# Patient Record
Sex: Male | Born: 1985 | Race: Black or African American | Hispanic: No | Marital: Single | Smoking: Current every day smoker
Health system: Southern US, Community
[De-identification: ages and names within clinical notes are randomized; demographics above are authoritative.]

---

## 2021-10-21 ENCOUNTER — Other Ambulatory Visit: Payer: Self-pay

## 2021-10-21 ENCOUNTER — Emergency Department (HOSPITAL_COMMUNITY): Payer: Self-pay

## 2021-10-21 ENCOUNTER — Encounter (HOSPITAL_COMMUNITY): Payer: Self-pay

## 2021-10-21 ENCOUNTER — Emergency Department (HOSPITAL_COMMUNITY)
Admission: EM | Admit: 2021-10-21 | Discharge: 2021-10-21 | Disposition: A | Discharge status: Home / Self Care | Payer: Self-pay | Attending: Emergency Medicine | Admitting: Emergency Medicine

## 2021-10-21 DIAGNOSIS — W06XXXA Fall from bed, initial encounter: Secondary | ICD-10-CM | POA: Insufficient documentation

## 2021-10-21 DIAGNOSIS — M542 Cervicalgia: Secondary | ICD-10-CM | POA: Insufficient documentation

## 2021-10-21 DIAGNOSIS — R509 Fever, unspecified: Secondary | ICD-10-CM | POA: Insufficient documentation

## 2021-10-21 DIAGNOSIS — R519 Headache, unspecified: Secondary | ICD-10-CM | POA: Insufficient documentation

## 2021-10-21 DIAGNOSIS — N390 Urinary tract infection, site not specified: Secondary | ICD-10-CM

## 2021-10-21 DIAGNOSIS — R42 Dizziness and giddiness: Secondary | ICD-10-CM | POA: Insufficient documentation

## 2021-10-21 DIAGNOSIS — Z20822 Contact with and (suspected) exposure to covid-19: Secondary | ICD-10-CM | POA: Insufficient documentation

## 2021-10-21 DIAGNOSIS — R000 Tachycardia, unspecified: Secondary | ICD-10-CM | POA: Insufficient documentation

## 2021-10-21 LAB — LACTIC ACID, PLASMA: Lactic Acid, Venous: 1.1 mmol/L (ref 0.5–1.9)

## 2021-10-21 LAB — COMPREHENSIVE METABOLIC PANEL WITH GFR
ALT: 32 U/L (ref 0–44)
AST: 39 U/L (ref 15–41)
Albumin: 4.3 g/dL (ref 3.5–5.0)
Alkaline Phosphatase: 80 U/L (ref 38–126)
Anion gap: 13 (ref 5–15)
BUN: 18 mg/dL (ref 6–20)
CO2: 24 mmol/L (ref 22–32)
Calcium: 9.1 mg/dL (ref 8.9–10.3)
Chloride: 93 mmol/L — ABNORMAL LOW (ref 98–111)
Creatinine, Ser: 1.61 mg/dL — ABNORMAL HIGH (ref 0.61–1.24)
GFR, Estimated: 58 mL/min — ABNORMAL LOW (ref >60)
Glucose, Bld: 136 mg/dL — ABNORMAL HIGH (ref 70–99)
Potassium: 3.2 mmol/L — ABNORMAL LOW (ref 3.5–5.1)
Sodium: 130 mmol/L — ABNORMAL LOW (ref 135–145)
Total Bilirubin: 1.1 mg/dL (ref 0.3–1.2)
Total Protein: 9.2 g/dL — ABNORMAL HIGH (ref 6.5–8.1)

## 2021-10-21 LAB — CBC WITH DIFFERENTIAL/PLATELET
Abs Immature Granulocytes: 0.09 K/uL — ABNORMAL HIGH (ref 0.00–0.07)
Basophils Absolute: 0 K/uL (ref 0.0–0.1)
Basophils Relative: 0 %
Eosinophils Absolute: 0 K/uL (ref 0.0–0.5)
Eosinophils Relative: 0 %
HCT: 47.1 % (ref 39.0–52.0)
Hemoglobin: 16.3 g/dL (ref 13.0–17.0)
Immature Granulocytes: 1 %
Lymphocytes Relative: 8 %
Lymphs Abs: 1.5 K/uL (ref 0.7–4.0)
MCH: 30.5 pg (ref 26.0–34.0)
MCHC: 34.6 g/dL (ref 30.0–36.0)
MCV: 88.2 fL (ref 80.0–100.0)
Monocytes Absolute: 2.4 K/uL — ABNORMAL HIGH (ref 0.1–1.0)
Monocytes Relative: 13 %
Neutro Abs: 15.4 K/uL — ABNORMAL HIGH (ref 1.7–7.7)
Neutrophils Relative %: 78 %
Platelets: 250 K/uL (ref 150–400)
RBC: 5.34 MIL/uL (ref 4.22–5.81)
RDW: 12.6 % (ref 11.5–15.5)
WBC: 19.4 K/uL — ABNORMAL HIGH (ref 4.0–10.5)
nRBC: 0 % (ref 0.0–0.2)

## 2021-10-21 LAB — URINALYSIS, ROUTINE W REFLEX MICROSCOPIC
Glucose, UA: NEGATIVE mg/dL
Leukocytes,Ua: NEGATIVE
Nitrite: POSITIVE — AB
Protein, ur: >=300 mg/dL — AB
Specific Gravity, Urine: >=1.03 (ref 1.005–1.030)
pH: 6.5 (ref 5.0–8.0)

## 2021-10-21 LAB — PROTIME-INR
INR: 1.2 (ref 0.8–1.2)
Prothrombin Time: 15.1 s (ref 11.4–15.2)

## 2021-10-21 LAB — RESP PANEL BY RT-PCR (FLU A&B, COVID) ARPGX2
Influenza A by PCR: NEGATIVE
Influenza B by PCR: NEGATIVE
SARS Coronavirus 2 by RT PCR: NEGATIVE

## 2021-10-21 LAB — GROUP A STREP BY PCR: Group A Strep by PCR: NOT DETECTED

## 2021-10-21 MED ORDER — CEPHALEXIN 500 MG PO CAPS: 500.0000 mg | ORAL_CAPSULE | Freq: Four times a day (QID) | ORAL | 0 refills | Status: AC

## 2021-10-21 MED ORDER — ACETAMINOPHEN 325 MG PO TABS
650.0000 mg | ORAL_TABLET | Freq: Once | ORAL | Status: AC
  Administered 2021-10-21: 14:00:00 650 mg via ORAL
  Filled 2021-10-21: qty 2

## 2021-10-21 MED ORDER — SODIUM CHLORIDE 0.9 % IV SOLN
1.0000 g | Freq: Once | INTRAVENOUS | Status: AC
  Administered 2021-10-21: 19:00:00 1 g via INTRAVENOUS
  Filled 2021-10-21: qty 10

## 2021-10-21 MED ORDER — IBUPROFEN 200 MG PO TABS: 600.0000 mg | ORAL_TABLET | Freq: Once | ORAL | Status: DC

## 2021-10-21 MED ORDER — SODIUM CHLORIDE 0.9 % IV BOLUS
1000.0000 mL | Freq: Once | INTRAVENOUS | Status: AC
  Administered 2021-10-21: 15:00:00 1000 mL via INTRAVENOUS

## 2021-10-21 MED ORDER — POTASSIUM CHLORIDE CRYS ER 20 MEQ PO TBCR
40.0000 meq | EXTENDED_RELEASE_TABLET | Freq: Once | ORAL | Status: AC
  Administered 2021-10-21: 16:00:00 40 meq via ORAL
  Filled 2021-10-21: qty 2

## 2021-10-21 NOTE — ED Triage Notes (Signed)
Woke up Monday morning with a headache and neck stiffness. Also had cold chills and sweats.   Yesterday pt slept all day. Pt fiance tried to wake him up and pt was disoriented and unable to answer questions.   Pt lost his balance this morning and fell down the stairs. Per fiance pt is still not answer questions correctly or respond at all.   A/OX3. Pt did not know the year but new it was December.

## 2021-10-21 NOTE — Discharge Instructions (Signed)
You have a urinary tract infection.  Please take Keflex 4 times daily for the next 10 days.  It is very important that you complete all of your antibiotics even if your symptoms resolve.  For fevers and pain you can take Tylenol 1000 mg every 6 hours.  Avoid ibuprofen, Aleve, Motrin, aspirin or other NSAIDs as these can worsen your kidney function.  Your kidney function was elevated we do not have any prior labs to compare to.  You will need to follow-up closely to have this rechecked.  Please call tomorrow morning to schedule close follow-up appointment with urology.  If you have worsening fevers, vomiting, abdominal pain or back pain you should return to the emergency department for reevaluation.  We discussed doing a CT scan today but you prefer to go home with antibiotics, if your symptoms worsen you will likely need the scan for further evaluation as urinary tract infections are uncommon in men.  The rest of your work-up today was reassuring.  Negative test for COVID and influenza.

## 2021-10-21 NOTE — ED Provider Notes (Signed)
Emergency Medicine Provider Triage Evaluation Note  Dominic Ruiz , a 35 y.o. male  was evaluated in triage.  Pt complains of body aches, headache, neck stiffness. Confusion, balance issue, not answering questions as normal.  Review of Systems  Positive: Headache, neck stiffness Negative: Chest pain, shortness of breath  Physical Exam  BP (!) 118/92 (BP Location: Left Arm)    Pulse (!) 114    Temp (!) 103.3 F (39.6 C) (Oral)    Resp 18    SpO2 96%  Gen:   Awake, ill appearing Resp:  Normal effort MSK:   Moves extremities without difficulty  Other:  Cn3-12 grossly intact, neck pain with movement but still intact rom, intact strength 4/4 bil UE / LE  Medical Decision Making  Medically screening exam initiated at 1:59 PM.  Appropriate orders placed.  Dominic Ruiz was informed that the remainder of the evaluation will be completed by another provider, this initial triage assessment does not replace that evaluation, and the importance of remaining in the ED until their evaluation is complete.  Worried about meningitis, or sepsis of unknown origin given high fever, patient to be sent to room immediately   [place] 10/21/21 1401    Bethann Berkshire, MD 10/21/21 1621

## 2021-10-21 NOTE — ED Provider Notes (Signed)
Care assumed from PA Fayrene Helper at shift change, please see his note for full details, but in brief Dominic Ruiz is a 35 y.o. male patient presents with 3 days of fever, chills and headache.  He denies other associated symptoms.  Headaches have been intermittent, described as a bandlike headache.  He also reports some decreased appetite.  No known sick contacts.  Despite fever and reported headache patient is very well-appearing with no meningeal signs.  Prior care team including PA Fayrene Helper and Dr. Rosalia Hammers both felt that this was very unlikely to be meningitis and the patient did not need a lumbar puncture.  Patient's lab work has been significant for a leukocytosis of 19.4.  CMP shows creatinine of 1.61 with no prior available for comparison, potassium of 3.2, p.o. potassium replacement given and sodium of 130, patient given IV fluids.  Normal LFTs.  Lactic acid is within normal limits.  Blood cultures pending.  Chest x-ray without evidence of pneumonia or other acute abnormality.  Respiratory panel, strep test and urinalysis pending at shift change.  Physical Exam  BP (!) 138/92    Pulse (!) 109    Temp 99 F (37.2 C) (Oral)    Resp 20    Ht 5\' 9"  (1.753 m)    Wt 95.3 kg    SpO2 100%    BMI 31.01 kg/m   Physical Exam Vitals and nursing note reviewed.  Constitutional:      General: He is not in acute distress.    Appearance: Normal appearance. He is well-developed. He is not ill-appearing or diaphoretic.     Comments: Alert, well appearing and in no acute distress  HENT:     Head: Normocephalic and atraumatic.     Mouth/Throat:     Mouth: Mucous membranes are moist.     Pharynx: Oropharynx is clear. Posterior oropharyngeal erythema present.  Eyes:     General:        Right eye: No discharge.        Left eye: No discharge.  Cardiovascular:     Rate and Rhythm: Regular rhythm. Tachycardia present.     Pulses: Normal pulses.     Heart sounds: Normal heart sounds.  Pulmonary:      Effort: Pulmonary effort is normal. No respiratory distress.     Comments: Respirations equal and unlabored, patient able to speak in full sentences, lungs clear to auscultation bilaterally  Abdominal:     General: There is no distension.     Palpations: Abdomen is soft. There is no mass.     Tenderness: There is no abdominal tenderness. There is no right CVA tenderness, left CVA tenderness or guarding.     Comments: Abdomen soft, nondistended, nontender to palpation in all quadrants without guarding or peritoneal signs, No CVA tenderness  Musculoskeletal:        General: No deformity.     Cervical back: Neck supple. No rigidity.  Skin:    General: Skin is warm and dry.     Findings: No rash.  Neurological:     Mental Status: He is alert and oriented to person, place, and time.     Coordination: Coordination normal.  Psychiatric:        Mood and Affect: Mood normal.        Behavior: Behavior normal.    ED Course/Procedures   Labs Reviewed  COMPREHENSIVE METABOLIC PANEL - Abnormal; Notable for the following components:      Result Value  Sodium 130 (*)    Potassium 3.2 (*)    Chloride 93 (*)    Glucose, Bld 136 (*)    Creatinine, Ser 1.61 (*)    Total Protein 9.2 (*)    GFR, Estimated 58 (*)    All other components within normal limits  CBC WITH DIFFERENTIAL/PLATELET - Abnormal; Notable for the following components:   WBC 19.4 (*)    Neutro Abs 15.4 (*)    Monocytes Absolute 2.4 (*)    Abs Immature Granulocytes 0.09 (*)    All other components within normal limits  RESP PANEL BY RT-PCR (FLU A&B, COVID) ARPGX2  GROUP A STREP BY PCR  CULTURE, BLOOD (ROUTINE X 2)  CULTURE, BLOOD (ROUTINE X 2)  LACTIC ACID, PLASMA  PROTIME-INR  URINALYSIS, ROUTINE W REFLEX MICROSCOPIC   DG Chest 2 View  Result Date: 10/21/2021 CLINICAL DATA:  Suspected sepsis. EXAM: CHEST - 2 VIEW COMPARISON:  None. FINDINGS: The heart size and mediastinal contours are within normal limits. No focal  consolidation. No pleural effusion. No pneumothorax. The visualized skeletal structures are unremarkable. IMPRESSION: No active cardiopulmonary disease. Electronically Signed   By: Maudry Mayhew M.D.   On: 10/21/2021 14:48     Procedures  MDM   After receiving IV fluids and Tylenol patient reports he is feeling much better, headache has resolved.  Strep testing as well as COVID and flu testing have all returned negative.  Chest x-ray without pneumonia.  Patient with no abdominal tenderness.  Awaiting UA.  Patient remains alert, well-appearing and mentating normally without meningeal signs, very low suspicion for meningitis.  Patient's urinalysis has positive nitrites, RBCs and WBCs present with rare bacteria.  Concerning for infection.  Patient has no prior history of urinary tract infection but also is not experiencing any flank pain, abdominal tenderness or CVA tenderness so lower suspicion for renal stone or pyelonephritis.  Urine culture sent.  Discussed with patient doing CT renal stone study but patient is anxious to leave, reports he is feeling much better and he would prefer to just take antibiotics.  We will give patient a dose of Rocephin here and treat him with extended course of Keflex.  Patient to avoid NSAIDs but can use Tylenol as needed for fevers or pain.  We will have patient follow-up closely with urology.  I have also provided patient with strict return precautions.  He expresses understanding and agreement with plan.  Discharged home with antibiotics       Legrand Rams 10/21/21 2100    Linwood Dibbles, MD 10/25/21 [phone]

## 2021-10-21 NOTE — ED Provider Notes (Signed)
[facility] [place] [facility]EM[facility]GENCY [facility]PT Provider Note   CSN: [id number] Arrival date & time: 10/21/21  1313     History Chief Complaint  Patient presents with   Fever    Dominic Ruiz is a 35 y.o. male.  The history is provided by the patient. No language interpreter was used.  Fever   35 year old mae with hx of asthma presenting for complaint of fever and headache. 3 days of fever, neck pain, chills, headache.  Denies sore throat, ear aches, sore throat, cp, sob, congestion, cough, abd pain, n/v/d, numbness or tingling of extremities.  No treatment tried.  Able to ambulate normally at home but feels lightheadedness and dizzy when he does.  Headache is intermittent, comes and goes without treatment.  Bandlike headache.  Not eating much but able to tolerates fluid.  No recent sick contact.  No alcohol/drugs/tobacco use.  No hx of HSV and no recent unprotected sex.  Yesterday he did have an incident when he fell down the stairs when he tripped on his leg, denies hitting head of LOC.  Fiance was concerns because pt is more confused and slept more than usual.  Pt does report feeling a bit off.  No other chronic medical condition.   History reviewed. No pertinent past medical history.  There are no problems to display for this patient.   History reviewed. No pertinent surgical history.     No family history on file.     Home Medications Prior to Admission medications   Not on File    Allergies    Patient has no allergy information on record.  Review of Systems   Review of Systems  Constitutional:  Positive for fever.  All other systems reviewed and are negative.  Physical Exam Updated Vital Signs BP (!) 118/92 (BP Location: Left Arm)    Pulse (!) 114    Temp (!) 103.3 F (39.6 C) (Oral)    Resp 18    SpO2 96%   Physical Exam Vitals and nursing note reviewed.  Constitutional:      General: He is not in acute distress.    Appearance: He is  well-developed.     Comments: Patient is laying in bed appears to be in no acute discomfort.  Patient is nontoxic in appearance.  HENT:     Head: Normocephalic and atraumatic.     Right Ear: Tympanic membrane normal.     Left Ear: Tympanic membrane normal.     Nose: Nose normal.     Mouth/Throat:     Mouth: Mucous membranes are moist.     Comments: Uvula midline.  Mild post-oropharyngeal erythema, no abscess.  Eyes:     Conjunctiva/sclera: Conjunctivae normal.  Neck:     Comments: No nuchal rigidity Cardiovascular:     Rate and Rhythm: Tachycardia present.     Pulses: Normal pulses.     Heart sounds: Normal heart sounds.  Pulmonary:     Effort: Pulmonary effort is normal.     Breath sounds: Normal breath sounds. No wheezing, rhonchi or rales.  Abdominal:     Palpations: Abdomen is soft.     Tenderness: There is no abdominal tenderness.  Musculoskeletal:     Cervical back: Normal range of motion and neck supple. No rigidity.     Comments: 5 out of 5 strength all 4 extremities  Skin:    Findings: No rash.  Neurological:     Mental Status: He is alert and oriented to  person, place, and time.     GCS: GCS eye subscore is 4. GCS verbal subscore is 5. GCS motor subscore is 6.     Cranial Nerves: Cranial nerves 2-12 are intact.     Sensory: Sensation is intact.     Motor: Motor function is intact.    ED Results / Procedures / Treatments   Labs (all labs ordered are listed, but only abnormal results are displayed) Labs Reviewed  COMPREHENSIVE METABOLIC PANEL - Abnormal; Notable for the following components:      Result Value   Sodium 130 (*)    Potassium 3.2 (*)    Chloride 93 (*)    Glucose, Bld 136 (*)    Creatinine, Ser 1.61 (*)    Total Protein 9.2 (*)    GFR, Estimated 58 (*)    All other components within normal limits  CBC WITH DIFFERENTIAL/PLATELET - Abnormal; Notable for the following components:   WBC 19.4 (*)    Neutro Abs 15.4 (*)    Monocytes Absolute 2.4  (*)    Abs Immature Granulocytes 0.09 (*)    All other components within normal limits  CULTURE, BLOOD (ROUTINE X 2)  CULTURE, BLOOD (ROUTINE X 2)  RESP PANEL BY RT-PCR (FLU A&B, COVID) ARPGX2  GROUP A STREP BY PCR  LACTIC ACID, PLASMA  PROTIME-INR  URINALYSIS, ROUTINE W REFLEX MICROSCOPIC    EKG None  Radiology No results found.  Procedures Procedures   Medications Ordered in ED Medications  acetaminophen (TYLENOL) tablet 650 mg (has no administration in time range)    ED Course  I have reviewed the triage vital signs and the nursing notes.  Pertinent labs & imaging results that were available during my care of the patient were reviewed by me and considered in my medical decision making (see chart for details).    MDM Rules/Calculators/A&P                           BP 134/84    Pulse (!) 103    Temp (!) 103.3 F (39.6 C) (Oral)    Resp 17    SpO2 92%   Final Clinical Impression(s) / ED Diagnoses Final diagnoses:  None    Rx / DC Orders ED Discharge Orders     None      2:31 PM Patient here with complaints of fever and headache.  No other cold symptoms noted.  Does have an oral temperature of 103.3 and is tachycardic with a heart rate of 114.  Patient however is without any nuchal rigidity concerning for meningitis.  He is overall well-appearing.  He does not have any focal neurodeficit on exam.  I have very low suspicion for meningitis as a source of his infection as he has absolutely no nuchal rigidity no photophobia or phonophobia.  Patient tripped and fell yesterday and but is more of a mechanical fall.  He is mentating appropriately.  We will give Tylenol IV fluid, work-up initiated.  Code sepsis initiated.  3:26 PM Mild aki with Cr. 1.6.  IVF given.  Normal lactic acid.  WBC is 19.  Pt sign out to oncoming team who will f/u on labs and determine disposition.  Care discussed with Dr. Rosalia Hammers.    Fayrene Helper, PA-C 10/21/21 1527    Margarita Grizzle,  MD 10/22/21 [phone]

## 2021-10-22 LAB — URINE CULTURE: Culture: NO GROWTH

## 2021-10-26 LAB — CULTURE, BLOOD (ROUTINE X 2)
Culture: NO GROWTH
Culture: NO GROWTH

## 2022-01-14 ENCOUNTER — Other Ambulatory Visit: Payer: Self-pay | Admitting: Urology

## 2022-01-14 DIAGNOSIS — R3129 Other microscopic hematuria: Secondary | ICD-10-CM

## 2022-03-18 ENCOUNTER — Other Ambulatory Visit: Payer: Self-pay

## 2022-04-12 ENCOUNTER — Ambulatory Visit
Admission: RE | Admit: 2022-04-12 | Discharge: 2022-04-12 | Disposition: A | Discharge status: Home / Self Care | Payer: Self-pay | Source: Ambulatory Visit | Attending: Urology | Admitting: Urology

## 2022-04-12 DIAGNOSIS — R3129 Other microscopic hematuria: Secondary | ICD-10-CM

## 2022-04-12 MED ORDER — IOPAMIDOL (ISOVUE-300) INJECTION 61%
150.0000 mL | Freq: Once | INTRAVENOUS | Status: AC | PRN
  Administered 2022-04-12: 150 mL via INTRAVENOUS

## 2022-11-22 ENCOUNTER — Ambulatory Visit (INDEPENDENT_AMBULATORY_CARE_PROVIDER_SITE_OTHER): Payer: 59 | Admitting: Medical

## 2022-11-22 VITALS — BP 135/80 | HR 83 | Resp 18 | Ht 69.0 in | Wt 214.0 lb

## 2022-11-22 DIAGNOSIS — R739 Hyperglycemia, unspecified: Secondary | ICD-10-CM | POA: Diagnosis not present

## 2022-11-22 DIAGNOSIS — Z0000 Encounter for general adult medical examination without abnormal findings: Secondary | ICD-10-CM | POA: Diagnosis not present

## 2022-11-22 NOTE — Patient Instructions (Addendum)
For you wellness exam today I have ordered cbc, cmp and lipid panel. Will get a1c with lab work.  Vaccine given today. Offered tdap and flu vaccine. Both declined.  Recommend exercise and healthy diet.  We will let you know lab results as they come in.  Follow up 2 weeks or sooner if needed.  Bp mild high initially then better when I checked. Want you to check bp daily at home 1-2 times daily. Check before smoking and after. Bp elevation could be cause of ha. Will see if bp level high and if you correlate with HAs. If no bp elevation but ha persists with light senstivity then will give migraine type med triptan. Presenlty for ha can use tylenol but avoid nsaids due to potential increase of blood pressure.  Want to see bp levels less than 140/90. Write down you bp levels to review on follow up in 2 weeks.   Preventive Care 46-90 Years Old, Male Preventive care refers to lifestyle choices and visits with your health care provider that can promote health and wellness. Preventive care visits are also called wellness exams. What can I expect for my preventive care visit? Counseling During your preventive care visit, your health care provider may ask about your: Medical history, including: Past medical problems. Family medical history. Current health, including: Emotional well-being. Home life and relationship well-being. Sexual activity. Lifestyle, including: Alcohol, nicotine or tobacco, and drug use. Access to firearms. Diet, exercise, and sleep habits. Safety issues such as seatbelt and bike helmet use. Sunscreen use. Work and work Statistician. Physical exam Your health care provider may check your: Height and weight. These may be used to calculate your BMI (body mass index). BMI is a measurement that tells if you are at a healthy weight. Waist circumference. This measures the distance around your waistline. This measurement also tells if you are at a healthy weight and may help  predict your risk of certain diseases, such as type 2 diabetes and high blood pressure. Heart rate and blood pressure. Body temperature. Skin for abnormal spots. What immunizations do I need?  Vaccines are usually given at various ages, according to a schedule. Your health care provider will recommend vaccines for you based on your age, medical history, and lifestyle or other factors, such as travel or where you work. What tests do I need? Screening Your health care provider may recommend screening tests for certain conditions. This may include: Lipid and cholesterol levels. Diabetes screening. This is done by checking your blood sugar (glucose) after you have not eaten for a while (fasting). Hepatitis B test. Hepatitis C test. HIV (human immunodeficiency virus) test. STI (sexually transmitted infection) testing, if you are at risk. Talk with your health care provider about your test results, treatment options, and if necessary, the need for more tests. Follow these instructions at home: Eating and drinking  Eat a healthy diet that includes fresh fruits and vegetables, whole grains, lean protein, and low-fat dairy products. Drink enough fluid to keep your urine pale yellow. Take vitamin and mineral supplements as recommended by your health care provider. Do not drink alcohol if your health care provider tells you not to drink. If you drink alcohol: Limit how much you have to 0-2 drinks a day. Know how much alcohol is in your drink. In the [place] one drink equals one 12 oz bottle of beer (355 mL), one 5 oz glass of wine (148 mL), or one 1 oz glass of hard liquor (44 mL). Lifestyle  Brush your teeth every morning and night with fluoride toothpaste. Floss one time each day. Exercise for at least 30 minutes 5 or more days each week. Do not use any products that contain nicotine or tobacco. These products include cigarettes, chewing tobacco, and vaping devices, such as e-cigarettes. If you  need help quitting, ask your health care provider. Do not use drugs. If you are sexually active, practice safe sex. Use a condom or other form of protection to prevent STIs. Find healthy ways to manage stress, such as: Meditation, yoga, or listening to music. Journaling. Talking to a trusted person. Spending time with friends and family. Minimize exposure to UV radiation to reduce your risk of skin cancer. Safety Always wear your seat belt while driving or riding in a vehicle. Do not drive: If you have been drinking alcohol. Do not ride with someone who has been drinking. If you have been using any mind-altering substances or drugs. While texting. When you are tired or distracted. Wear a helmet and other protective equipment during sports activities. If you have firearms in your house, make sure you follow all gun safety procedures. Seek help if you have been physically or sexually abused. What's next? Go to your health care provider once a year for an annual wellness visit. Ask your health care provider how often you should have your eyes and teeth checked. Stay up to date on all vaccines. This information is not intended to replace advice given to you by your health care provider. Make sure you discuss any questions you have with your health care provider. Document Revised: 04/22/2021 Document Reviewed: 04/22/2021 Elsevier Patient Education  [employer].

## 2022-11-22 NOTE — Addendum Note (Signed)
Addended by: Jeronimo Greaves on: 11/22/2022 03:17 PM   Modules accepted: Orders
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
## 2022-11-23 LAB — COMPREHENSIVE METABOLIC PANEL
ALT: 19 U/L (ref 0–53)
AST: 17 U/L (ref 0–37)
Albumin: 4.6 g/dL (ref 3.5–5.2)
Alkaline Phosphatase: 61 U/L (ref 39–117)
BUN: 9 mg/dL (ref 6–23)
CO2: 30 mEq/L (ref 19–32)
Calcium: 9.5 mg/dL (ref 8.4–10.5)
Chloride: 102 mEq/L (ref 96–112)
Creatinine, Ser: 1.14 mg/dL (ref 0.40–1.50)
GFR: 83.59 mL/min (ref >60.00)
Glucose, Bld: 91 mg/dL (ref 70–99)
Potassium: 4.5 mEq/L (ref 3.5–5.1)
Sodium: 138 mEq/L (ref 135–145)
Total Bilirubin: 0.3 mg/dL (ref 0.2–1.2)
Total Protein: 7.4 g/dL (ref 6.0–8.3)

## 2022-11-23 LAB — LIPID PANEL
Cholesterol: 208 mg/dL — ABNORMAL HIGH (ref 0–200)
HDL: 42.4 mg/dL (ref >39.00)
LDL Cholesterol: 135 mg/dL — ABNORMAL HIGH (ref 0–99)
NonHDL: 165.21
Total CHOL/HDL Ratio: 5
Triglycerides: 153 mg/dL — ABNORMAL HIGH (ref 0.0–149.0)
VLDL: 30.6 mg/dL (ref 0.0–40.0)

## 2022-11-23 LAB — CBC WITH DIFFERENTIAL/PLATELET
Basophils Absolute: 0 10*3/uL (ref 0.0–0.1)
Basophils Relative: 0.5 % (ref 0.0–3.0)
Eosinophils Absolute: 0.1 10*3/uL (ref 0.0–0.7)
Eosinophils Relative: 1.5 % (ref 0.0–5.0)
HCT: 46.5 % (ref 39.0–52.0)
Hemoglobin: 15.6 g/dL (ref 13.0–17.0)
Lymphocytes Relative: 45.6 % (ref 12.0–46.0)
Lymphs Abs: 2.9 10*3/uL (ref 0.7–4.0)
MCHC: 33.5 g/dL (ref 30.0–36.0)
MCV: 93 fl (ref 78.0–100.0)
Monocytes Absolute: 0.6 10*3/uL (ref 0.1–1.0)
Monocytes Relative: 9.6 % (ref 3.0–12.0)
Neutro Abs: 2.7 10*3/uL (ref 1.4–7.7)
Neutrophils Relative %: 42.8 % — ABNORMAL LOW (ref 43.0–77.0)
Platelets: 259 10*3/uL (ref 150.0–400.0)
RBC: 5 Mil/uL (ref 4.22–5.81)
RDW: 13.4 % (ref 11.5–15.5)
WBC: 6.3 10*3/uL (ref 4.0–10.5)

## 2022-11-23 LAB — HEMOGLOBIN A1C: Hgb A1c MFr Bld: 6.1 % (ref 4.6–6.5)

## 2022-12-07 ENCOUNTER — Encounter: Payer: Self-pay | Admitting: Medical

## 2022-12-07 ENCOUNTER — Ambulatory Visit: Payer: 59 | Admitting: Medical

## 2022-12-07 ENCOUNTER — Ambulatory Visit (INDEPENDENT_AMBULATORY_CARE_PROVIDER_SITE_OTHER): Payer: 59 | Admitting: Medical

## 2022-12-07 VITALS — BP 140/80 | HR 85 | Temp 98.0°F | Resp 18 | Ht 69.0 in | Wt 214.0 lb

## 2022-12-07 DIAGNOSIS — R030 Elevated blood-pressure reading, without diagnosis of hypertension: Secondary | ICD-10-CM | POA: Diagnosis not present

## 2022-12-07 DIAGNOSIS — G4489 Other headache syndrome: Secondary | ICD-10-CM

## 2022-12-07 MED ORDER — SUMATRIPTAN SUCCINATE 50 MG PO TABS: ORAL_TABLET | ORAL | 0 refills | Status: DC

## 2022-12-07 MED ORDER — KETOROLAC TROMETHAMINE 30 MG/ML IJ SOLN
30.0000 mg | Freq: Once | INTRAMUSCULAR | Status: AC
  Administered 2022-12-07: 30 mg via INTRAMUSCULAR

## 2022-12-07 NOTE — Patient Instructions (Signed)
Headache every other day in frontal region with at times some sound sensitivity.  Your blood pressure is still borderline.  Presently we gave Toradol 30 mg IM injection.  If your headache does not resolve by tomorrow please let me know.  If your headaches were to worsen or if you were to get motor or neurologic signs/ symptoms then be seen in emergency department.   Please start to check your blood pressure daily.  Write those readings down. Bring to next appointment.  If your headache is resolved but then does come back providing you with Imitrex tablets to use as directed.  Since you have some reported sound sensitivity with headaches on migraine headache in differential.  Recommend using Imitrex first time when you at home/not working.  Also I do want you to start to taper down from current marijuana use.  If headaches or not resolving and blood pressures are not high then considering going ahead and getting CT of head without contrast.  Currently I do not think is indicated.  Follow-up in 2 weeks or sooner if needed.

## 2022-12-07 NOTE — Progress Notes (Unsigned)
Subjective:    Patient ID: Dominic Ruiz, male    DOB: October 19, 1986, 37 y.o.   MRN: [id number]  HPI Pt here for follow up. He has not been checking his blood pressure at home. States working 12 hours shifts.   Bp bp was borderline on last visit and asked to check bp to see if ha correlated. No light sensitive but sometimes sound cause worse ha.   States ha once a day. Lamonte Sakai is usually in afternoon. No vision changes. No nausea or vomiting. No dizziness. Point to frontal area. No no nasal congestion of stuffiness.    Review of Systems  Constitutional:  Negative for chills, fatigue and fever.  Respiratory:  Negative for cough, chest tightness, shortness of breath and wheezing.   Cardiovascular:  Negative for chest pain and palpitations.  Gastrointestinal:  Negative for abdominal pain.  Genitourinary:  Negative for dysuria, flank pain and frequency.  Musculoskeletal:  Negative for back pain.  Skin:  Negative for rash.  Neurological:  Positive for headaches. Negative for dizziness, facial asymmetry and light-headedness.  Hematological:  Negative for adenopathy. Does not bruise/bleed easily.  Psychiatric/Behavioral:  Negative for agitation, behavioral problems, decreased concentration and hallucinations.     No past medical history on file.   Social History   Socioeconomic History   Marital status: Single    Spouse name: Not on file   Number of children: Not on file   Years of education: Not on file   Highest education level: Not on file  Occupational History   Not on file  Tobacco Use   Smoking status: Every Day    Packs/day: 0.50    Types: Cigarettes    Start date: 11/14/2012   Smokeless tobacco: Never  Substance and Sexual Activity   Alcohol use: Never   Drug use: Yes    Types: Marijuana    Comment: 5 blunts a day.   Sexual activity: Yes  Other Topics Concern   Not on file  Social History Narrative   Not on file   Social Determinants of Health   Financial  Resource Strain: Not on file  Food Insecurity: Not on file  Transportation Needs: Not on file  Physical Activity: Not on file  Stress: Not on file  Social Connections: Not on file  Intimate Partner Violence: Not on file    No past surgical history on file.  No family history on file.  No Known Allergies  No current outpatient medications on file prior to visit.   No current facility-administered medications on file prior to visit.    BP (!) 140/80   Pulse 85   Temp 98 F (36.7 C)   Resp 18   Ht 5\' 9"  (1.753 m)   Wt 214 lb (97.1 kg)   SpO2 98%   BMI 31.60 kg/m        Objective:   Physical Exam  General Mental Status- Alert. General Appearance- Not in acute distress.   Skin General: Color- Normal Color. Moisture- Normal Moisture.  Neck Carotid Arteries- Normal color. Moisture- Normal Moisture. No carotid bruits. No JVD.  Chest and Lung Exam Auscultation: Breath Sounds:-Normal.  Cardiovascular Auscultation:Rythm- Regular. Murmurs & Other Heart Sounds:Auscultation of the heart reveals- No Murmurs.  Abdomen Inspection:-Inspeection Normal. Palpation/Percussion:Note:No mass. Palpation and Percussion of the abdomen reveal- Non Tender, Non Distended + BS, no rebound or guarding.    Neurologic Cranial Nerve exam:- CN III-XII intact(No nystagmus), symmetric smile. Drift Test:- No drift. Romberg Exam:- Negative.  Heal to Toe Gait exam:-Normal. Finger to Nose:- Normal/Intact Strength:- 5/5 equal and symmetric strength both upper and lower extremities.       Assessment & Plan:   Patient Instructions  Headache every other day in frontal region with at times some sound sensitivity.  Your blood pressure is still borderline.  Presently we gave Toradol 30 mg IM injection.  If your headache does not resolve by tomorrow please let me know.  If your headaches were to worsen or if you were to get motor or neurologic signs/ symptoms then be seen in emergency  department.   Please start to check your blood pressure daily.  Write those readings down. Bring to next appointment.  If your headache is resolved but then does come back providing you with Imitrex tablets to use as directed.  Since you have some reported sound sensitivity with headaches on migraine headache in differential.  Recommend using Imitrex first time when you at home/not working.  Also I do want you to start to taper down from current marijuana use.  If headaches or not resolving and blood pressures are not high then considering going ahead and getting CT of head without contrast.  Currently I do not think is indicated.  Follow-up in 2 weeks or sooner if needed.   Mackie Pai, PA-C

## 2022-12-21 ENCOUNTER — Ambulatory Visit (INDEPENDENT_AMBULATORY_CARE_PROVIDER_SITE_OTHER): Payer: 59 | Admitting: Medical

## 2022-12-21 ENCOUNTER — Encounter: Payer: Self-pay | Admitting: Medical

## 2022-12-21 VITALS — BP 125/80 | HR 81 | Temp 98.0°F | Resp 18 | Ht 69.0 in | Wt 215.6 lb

## 2022-12-21 DIAGNOSIS — G4489 Other headache syndrome: Secondary | ICD-10-CM | POA: Diagnosis not present

## 2022-12-21 MED ORDER — KETOROLAC TROMETHAMINE 30 MG/ML IJ SOLN
30.0000 mg | Freq: Once | INTRAMUSCULAR | Status: AC
  Administered 2022-12-21: 30 mg via INTRAMUSCULAR

## 2022-12-21 MED ORDER — SUMATRIPTAN SUCCINATE 50 MG PO TABS: ORAL_TABLET | ORAL | 0 refills | Status: DC

## 2022-12-21 NOTE — Patient Instructions (Signed)
Headaches that are persisting on daily basis.  On discussion again you know sound sensitivity occurring with headaches about 2 out of 3 headaches.  Unfortunately you did not get the Imitrex prescription filled.  I resent that to your pharmacy today.  Today for headache 7 out of 10 level we gave you Toradol 30 mg.  Hopefully that will stop the headache as the last injection helped.  If your headaches do recur you could try a combination of Tylenol 325 mg to 650 mg along with ibuprofen 200 mg.    I advised potentially waiting until the weekend/when you are not working to use the first tablet of Imitrex on early onset of headache.  After assessing response to Imitrex may end up ordering CT head without contrast in light of the fact that these headaches are new and you never had significant headaches when you were younger.  Blood pressure was good today on recheck.  I do recommend occasionally checking your blood pressure approximately 3 times a week to verify that you are not getting significant elevation.  Follow-up in 10 to 14 days or sooner if needed.

## 2022-12-21 NOTE — Progress Notes (Signed)
Subjective:    Patient ID: Dominic Ruiz, male    DOB: 04-07-1986, 37 y.o.   MRN: [id number]  HPI  Pt in for follow up.  He tells me he never got imitrex. Pt is still getting ha once a day . No vision changes. No nausea or vomiting. No dizziness. Point to frontal area. No no nasal congestion of stuffiness.    On last visit he noted sound sensitivity. 2/3 headaches are sound. He stating getting ha every day. Even on weekends. He states this has been case for 4-5 months.  Most of time ha start late in early afternoon around 1pm   Currently 7/10 ha. He notes last time toradol did stop ha within 2 hours.   Bp when he checked 137/82 and 138/80.    Never had ha of any signifance in past.    Review of Systems  Constitutional:  Negative for chills, fatigue and fever.  Respiratory:  Negative for cough, chest tightness, shortness of breath and wheezing.   Cardiovascular:  Negative for chest pain and palpitations.  Gastrointestinal:  Negative for abdominal pain.  Musculoskeletal:  Negative for back pain.  Neurological:  Positive for headaches. Negative for dizziness, speech difficulty, weakness and numbness.  Hematological:  Negative for adenopathy. Does not bruise/bleed easily.  Psychiatric/Behavioral:  Negative for dysphoric mood and sleep disturbance. The patient is not nervous/anxious.     No past medical history on file.   Social History   Socioeconomic History   Marital status: Single    Spouse name: Not on file   Number of children: Not on file   Years of education: Not on file   Highest education level: Not on file  Occupational History   Not on file  Tobacco Use   Smoking status: Every Day    Packs/day: 0.50    Types: Cigarettes    Start date: 11/14/2012   Smokeless tobacco: Never  Substance and Sexual Activity   Alcohol use: Never   Drug use: Yes    Types: Marijuana    Comment: 5 blunts a day.   Sexual activity: Yes  Other Topics Concern   Not on file   Social History Narrative   Not on file   Social Determinants of Health   Financial Resource Strain: Not on file  Food Insecurity: Not on file  Transportation Needs: Not on file  Physical Activity: Not on file  Stress: Not on file  Social Connections: Not on file  Intimate Partner Violence: Not on file    No past surgical history on file.  No family history on file.  No Known Allergies  No current outpatient medications on file prior to visit.   No current facility-administered medications on file prior to visit.    BP 125/80   Pulse 81   Temp 98 F (36.7 C)   Resp 18   Ht 5' 9"$  (1.753 m)   Wt 215 lb 9.6 oz (97.8 kg)   SpO2 98%   BMI 31.84 kg/m        Objective:   Physical Exam  General Mental Status- Alert. General Appearance- Not in acute distress.   Skin General: Color- Normal Color. Moisture- Normal Moisture.  Neck No trapezius pain on palpation.   Chest and Lung Exam Auscultation: Breath Sounds:-Normal.  Cardiovascular Auscultation:Rythm- Regular. Murmurs & Other Heart Sounds:Auscultation of the heart reveals- No Murmurs.   Neurologic Cranial Nerve exam:- CN III-XII intact(No nystagmus), symmetric smile. Strength:- 5/5 equal and symmetric strength  both upper and lower extremities.       Assessment & Plan:   Patient Instructions  Headaches that are persisting on daily basis.  On discussion again you know sound sensitivity occurring with headaches about 2 out of 3 headaches.  Unfortunately you did not get the Imitrex prescription filled.  I resent that to your pharmacy today.  Today for headache 7 out of 10 level we gave you Toradol 30 mg.  Hopefully that will stop the headache as the last injection helped.  If your headaches do recur you could try a combination of Tylenol 325 mg to 650 mg along with ibuprofen 200 mg.    I advised potentially waiting until the weekend/when you are not working to use the first tablet of Imitrex on early onset  of headache.  After assessing response to Imitrex may end up ordering CT head without contrast in light of the fact that these headaches are new and you never had significant headaches when you were younger.  Blood pressure was good today on recheck.  I do recommend occasionally checking your blood pressure approximately 3 times a week to verify that you are not getting significant elevation.  Follow-up in 10 to 14 days or sooner if needed.   Mackie Pai, PA-C

## 2023-06-03 ENCOUNTER — Ambulatory Visit: Payer: 59 | Admitting: Medical

## 2023-06-03 NOTE — Progress Notes (Unsigned)
      Established patient visit   Patient: Dominic Ruiz   DOB: Dec 18, 1985   37 y.o. Male  MRN: [id number] Visit Date: 06/06/2023  Today's healthcare provider: Alfredia Ferguson, PA-C   No chief complaint on file.  Subjective    HPI  ***  Medications: Outpatient Medications Prior to Visit  Medication Sig   SUMAtriptan (IMITREX) 50 MG tablet May repeat in 2 hours if headache persists or recurs.   No facility-administered medications prior to visit.    Review of Systems     Objective    There were no vitals taken for this visit.  Physical Exam  ***  No results found for any visits on 06/06/23.  Assessment & Plan     ***  No follow-ups on file.      {provider attestation***:1}   Alfredia Ferguson, PA-C  [facility] at [facility] [phone] (phone) [phone] (fax)  [facility]

## 2023-06-06 ENCOUNTER — Ambulatory Visit (INDEPENDENT_AMBULATORY_CARE_PROVIDER_SITE_OTHER): Payer: 59 | Admitting: Physician Assistant

## 2023-06-06 ENCOUNTER — Encounter: Payer: Self-pay | Admitting: Physician Assistant

## 2023-06-06 VITALS — BP 120/60 | HR 81 | Temp 97.8°F | Resp 20 | Ht 69.0 in | Wt 210.0 lb

## 2023-06-06 DIAGNOSIS — J0110 Acute frontal sinusitis, unspecified: Secondary | ICD-10-CM

## 2023-06-06 DIAGNOSIS — Z720 Tobacco use: Secondary | ICD-10-CM | POA: Diagnosis not present

## 2023-06-06 DIAGNOSIS — R051 Acute cough: Secondary | ICD-10-CM | POA: Diagnosis not present

## 2023-06-06 DIAGNOSIS — J209 Acute bronchitis, unspecified: Secondary | ICD-10-CM

## 2023-06-06 MED ORDER — PREDNISONE 20 MG PO TABS: 20.0000 mg | ORAL_TABLET | Freq: Every day | ORAL | 0 refills | Status: DC

## 2023-06-06 MED ORDER — AMOXICILLIN-POT CLAVULANATE 875-125 MG PO TABS: 1.0000 | ORAL_TABLET | Freq: Two times a day (BID) | ORAL | 0 refills | Status: DC

## 2023-08-02 NOTE — Progress Notes (Deleted)
Established patient visit   Patient: Dominic Ruiz   DOB: [birth year]   36 y.o. Male  MRN: [id number] Visit Date: 08/03/2023  Today's healthcare provider: Alfredia Ferguson, PA-C   No chief complaint on file.  Subjective    HPI  ***  Medications: Outpatient Medications Prior to Visit  Medication Sig   amoxicillin-clavulanate (AUGMENTIN) 875-125 MG tablet Take 1 tablet by mouth 2 (two) times daily.   predniSONE (DELTASONE) 20 MG tablet Take 1 tablet (20 mg total) by mouth daily with breakfast.   No facility-administered medications prior to visit.    Review of Systems    Objective    There were no vitals taken for this visit.  Physical Exam  ***  No results found for any visits on 08/03/23.  Assessment & Plan     ***  No follow-ups on file.      {provider attestation***:1}   Alfredia Ferguson, PA-C  [facility] at [facility] [phone] (phone) [phone] (fax)  [facility]

## 2023-08-03 ENCOUNTER — Ambulatory Visit: Payer: 59 | Admitting: Physician Assistant

## 2023-08-08 ENCOUNTER — Encounter: Payer: Self-pay | Admitting: Medical

## 2023-08-08 ENCOUNTER — Ambulatory Visit (INDEPENDENT_AMBULATORY_CARE_PROVIDER_SITE_OTHER): Payer: 59 | Admitting: Medical

## 2023-08-08 VITALS — BP 110/70 | HR 81 | Temp 98.5°F | Resp 16 | Ht 69.0 in | Wt 211.2 lb

## 2023-08-08 DIAGNOSIS — M545 Low back pain, unspecified: Secondary | ICD-10-CM

## 2023-08-08 DIAGNOSIS — L089 Local infection of the skin and subcutaneous tissue, unspecified: Secondary | ICD-10-CM | POA: Diagnosis not present

## 2023-08-08 MED ORDER — DICLOFENAC SODIUM 75 MG PO TBEC: 75.0000 mg | DELAYED_RELEASE_TABLET | Freq: Two times a day (BID) | ORAL | 0 refills | Status: AC

## 2023-08-08 MED ORDER — CYCLOBENZAPRINE HCL 5 MG PO TABS: 5.0000 mg | ORAL_TABLET | Freq: Every day | ORAL | 0 refills | Status: AC

## 2023-08-08 MED ORDER — DOXYCYCLINE HYCLATE 100 MG PO TABS: 100.0000 mg | ORAL_TABLET | Freq: Two times a day (BID) | ORAL | 0 refills | Status: AC

## 2023-08-08 NOTE — Patient Instructions (Signed)
Low Back Pain Acute onset, no history of trauma or injury. Pain exacerbated by bending and twisting. No radicular symptoms.  -Start Diclofenac twice daily for anti-inflammatory effect. -Start muscle relaxant at night. -Back stretching exercises. -If no improvement in 5-7 days, consider X-ray and referral to physical therapy or sports medicine.  Skin Infection (Left Upper Back) Noticed a lump three days ago, no systemic symptoms. Appears to be an early skin infection, with potential to form an abscess. -Start Doxycycline 1 tablet twice daily for 10 days. -Apply warm salt water soaked gauze to the area twice daily. -If abscess forms, may require incision and drainage.   Back Exercises The following exercises strengthen the muscles that help to support the trunk (torso) and back. They also help to keep the lower back flexible. Doing these exercises can help to prevent or lessen existing low back pain. If you have back pain or discomfort, try doing these exercises 2-3 times each day or as told by your health care provider. As your pain improves, do them once each day, but increase the number of times that you repeat the steps for each exercise (do more repetitions). To prevent the recurrence of back pain, continue to do these exercises once each day or as told by your health care provider. Do exercises exactly as told by your health care provider and adjust them as directed. It is normal to feel mild stretching, pulling, tightness, or discomfort as you do these exercises, but you should stop right away if you feel sudden pain or your pain gets worse. Exercises Single knee to chest Repeat these steps 3-5 times for each leg: Lie on your back on a firm bed or the floor with your legs extended. Bring one knee to your chest. Your other leg should stay extended and in contact with the floor. Hold your knee in place by grabbing your knee or thigh with both hands and hold. Pull on your knee until you  feel a gentle stretch in your lower back or buttocks. Hold the stretch for 10-30 seconds. Slowly release and straighten your leg.  Pelvic tilt Repeat these steps 5-10 times: Lie on your back on a firm bed or the floor with your legs extended. Bend your knees so they are pointing toward the ceiling and your feet are flat on the floor. Tighten your lower abdominal muscles to press your lower back against the floor. This motion will tilt your pelvis so your tailbone points up toward the ceiling instead of pointing to your feet or the floor. With gentle tension and even breathing, hold this position for 5-10 seconds.  Cat-cow Repeat these steps until your lower back becomes more flexible: Get into a hands-and-knees position on a firm bed or the floor. Keep your hands under your shoulders, and keep your knees under your hips. You may place padding under your knees for comfort. Let your head hang down toward your chest. Contract your abdominal muscles and point your tailbone toward the floor so your lower back becomes rounded like the back of a cat. Hold this position for 5 seconds. Slowly lift your head, let your abdominal muscles relax, and point your tailbone up toward the ceiling so your back forms a sagging arch like the back of a cow. Hold this position for 5 seconds.  Press-ups Repeat these steps 5-10 times: Lie on your abdomen (face-down) on a firm bed or the floor. Place your palms near your head, about shoulder-width apart. Keeping your back as relaxed  as possible and keeping your hips on the floor, slowly straighten your arms to raise the top half of your body and lift your shoulders. Do not use your back muscles to raise your upper torso. You may adjust the placement of your hands to make yourself more comfortable. Hold this position for 5 seconds while you keep your back relaxed. Slowly return to lying flat on the floor.  Bridges Repeat these steps 10 times: Lie on your back on a  firm bed or the floor. Bend your knees so they are pointing toward the ceiling and your feet are flat on the floor. Your arms should be flat at your sides, next to your body. Tighten your buttocks muscles and lift your buttocks off the floor until your waist is at almost the same height as your knees. You should feel the muscles working in your buttocks and the back of your thighs. If you do not feel these muscles, slide your feet 1-2 inches (2.5-5 cm) farther away from your buttocks. Hold this position for 3-5 seconds. Slowly lower your hips to the starting position, and allow your buttocks muscles to relax completely. If this exercise is too easy, try doing it with your arms crossed over your chest. Abdominal crunches Repeat these steps 5-10 times: Lie on your back on a firm bed or the floor with your legs extended. Bend your knees so they are pointing toward the ceiling and your feet are flat on the floor. Cross your arms over your chest. Tip your chin slightly toward your chest without bending your neck. Tighten your abdominal muscles and slowly raise your torso high enough to lift your shoulder blades a tiny bit off the floor. Avoid raising your torso higher than that because it can put too much stress on your lower back and does not help to strengthen your abdominal muscles. Slowly return to your starting position.  Back lifts Repeat these steps 5-10 times: Lie on your abdomen (face-down) with your arms at your sides, and rest your forehead on the floor. Tighten the muscles in your legs and your buttocks. Slowly lift your chest off the floor while you keep your hips pressed to the floor. Keep the back of your head in line with the curve in your back. Your eyes should be looking at the floor. Hold this position for 3-5 seconds. Slowly return to your starting position.  Contact a health care provider if: Your back pain or discomfort gets much worse when you do an exercise. Your worsening  back pain or discomfort does not lessen within 2 hours after you exercise. If you have any of these problems, stop doing these exercises right away. Do not do them again unless your health care provider says that you can. Get help right away if: You develop sudden, severe back pain. If this happens, stop doing the exercises right away. Do not do them again unless your health care provider says that you can. This information is not intended to replace advice given to you by your health care provider. Make sure you discuss any questions you have with your health care provider. Document Revised: 11/28/2022 Document Reviewed: 01/07/2021 Elsevier Patient Education  2024 [employer]    follow up 2 weeks or sooner if needed

## 2023-08-08 NOTE — Progress Notes (Signed)
Subjective:    Patient ID: Dominic Ruiz, male    DOB: 06-20-1986, 37 y.o.   MRN: [id number]  HPI Discussed the use of AI scribe software for clinical note transcription with the patient, who gave verbal consent to proceed.  History of Present Illness   The patient presents with a four-day history of lower back pain, which is localized in the middle and right side. The pain onset was sudden, with the patient waking up with the discomfort. There was no preceding accident, fall, or excessive physical activity. The pain is exacerbated by bending over and twisting. The patient has a history of lower back pain associated with a kidney infection, but the current pain is different and more centrally located. The patient denies any radiating pain to the legs or urinary symptoms.  In addition, the patient reports a painful lump in the left  upper back, present for a couple of weeks. The lump became noticeable three days prior to the consultation. The patient denies any systemic symptoms such as fevers, chills, or sweats. The lump is painful when the patient lifts his arm.  The patient has been managing the lower back pain with ibuprofen, which has not been effective. The patient has not reported any new medications.         Review of Systems  Constitutional:  Negative for chills, fatigue and fever.  Respiratory:  Negative for cough, chest tightness, shortness of breath and wheezing.   Cardiovascular:  Negative for chest pain and palpitations.  Gastrointestinal:  Negative for abdominal pain, nausea and vomiting.  Musculoskeletal:  Positive for back pain.       Lumbar pain. No radicuaar pain. No leg weakness. No perineum pain.   Skin:        Left side lower trapezius skin tendernss       Objective:   Physical Exam  General Appearance- Not in acute distress.    Chest and Lung Exam Auscultation: Breath sounds:-Normal. Clear even and unlabored. Adventitious sounds:- No Adventitious  sounds.  Cardiovascular Auscultation:Rythm - Regular, rate and rythm. Heart Sounds -Normal heart sounds.  Abdomen Inspection:-Inspection Normal.  Palpation/Perucssion: Palpation and Percussion of the abdomen reveal- Non Tender, No Rebound tenderness, No rigidity(Guarding) and No Palpable abdominal masses.  Liver:-Normal.  Spleen:- Normal.   Back Mid lumbar spine tenderness to palpation.(Rt side paralumbar are more tender to palpation than midline. Pain on straight leg lift. Pain on lateral movements and flexion/extension of the spine.  Lower ext neurologic  L5-S1 sensation intact bilaterally. Normal patellar reflexes bilaterally. No foot drop bilaterally.   Left lower trapezius area 3 cm mild warm area to skin that is indurated. But no fluctuant in middle.    Assessment & Plan:  Assessment and Plan    Low Back Pain Acute onset, no history of trauma or injury. Pain exacerbated by bending and twisting. No radicular symptoms.  -Start Diclofenac twice daily for anti-inflammatory effect. -Start muscle relaxant at night. -Back stretching exercises. -If no improvement in 5-7 days, consider X-ray and referral to physical therapy or sports medicine.  Skin Infection (Left Upper Back) Noticed a lump three days ago, no systemic symptoms. Appears to be an early skin infection, with potential to form an abscess. -Start Doxycycline 1 tablet twice daily for 10 days. -Apply warm salt water soaked gauze to the area twice daily. -If abscess forms, may require incision and drainage.   follow up 2 weeks or sooner if needed

## 2023-08-17 IMAGING — CR DG CHEST 2V
2 series · 2 of 2 positions shown · non-contrast
Comparison: None.

CLINICAL DATA: Suspected sepsis.

EXAM:
CHEST - 2 VIEW

[w chest pa]
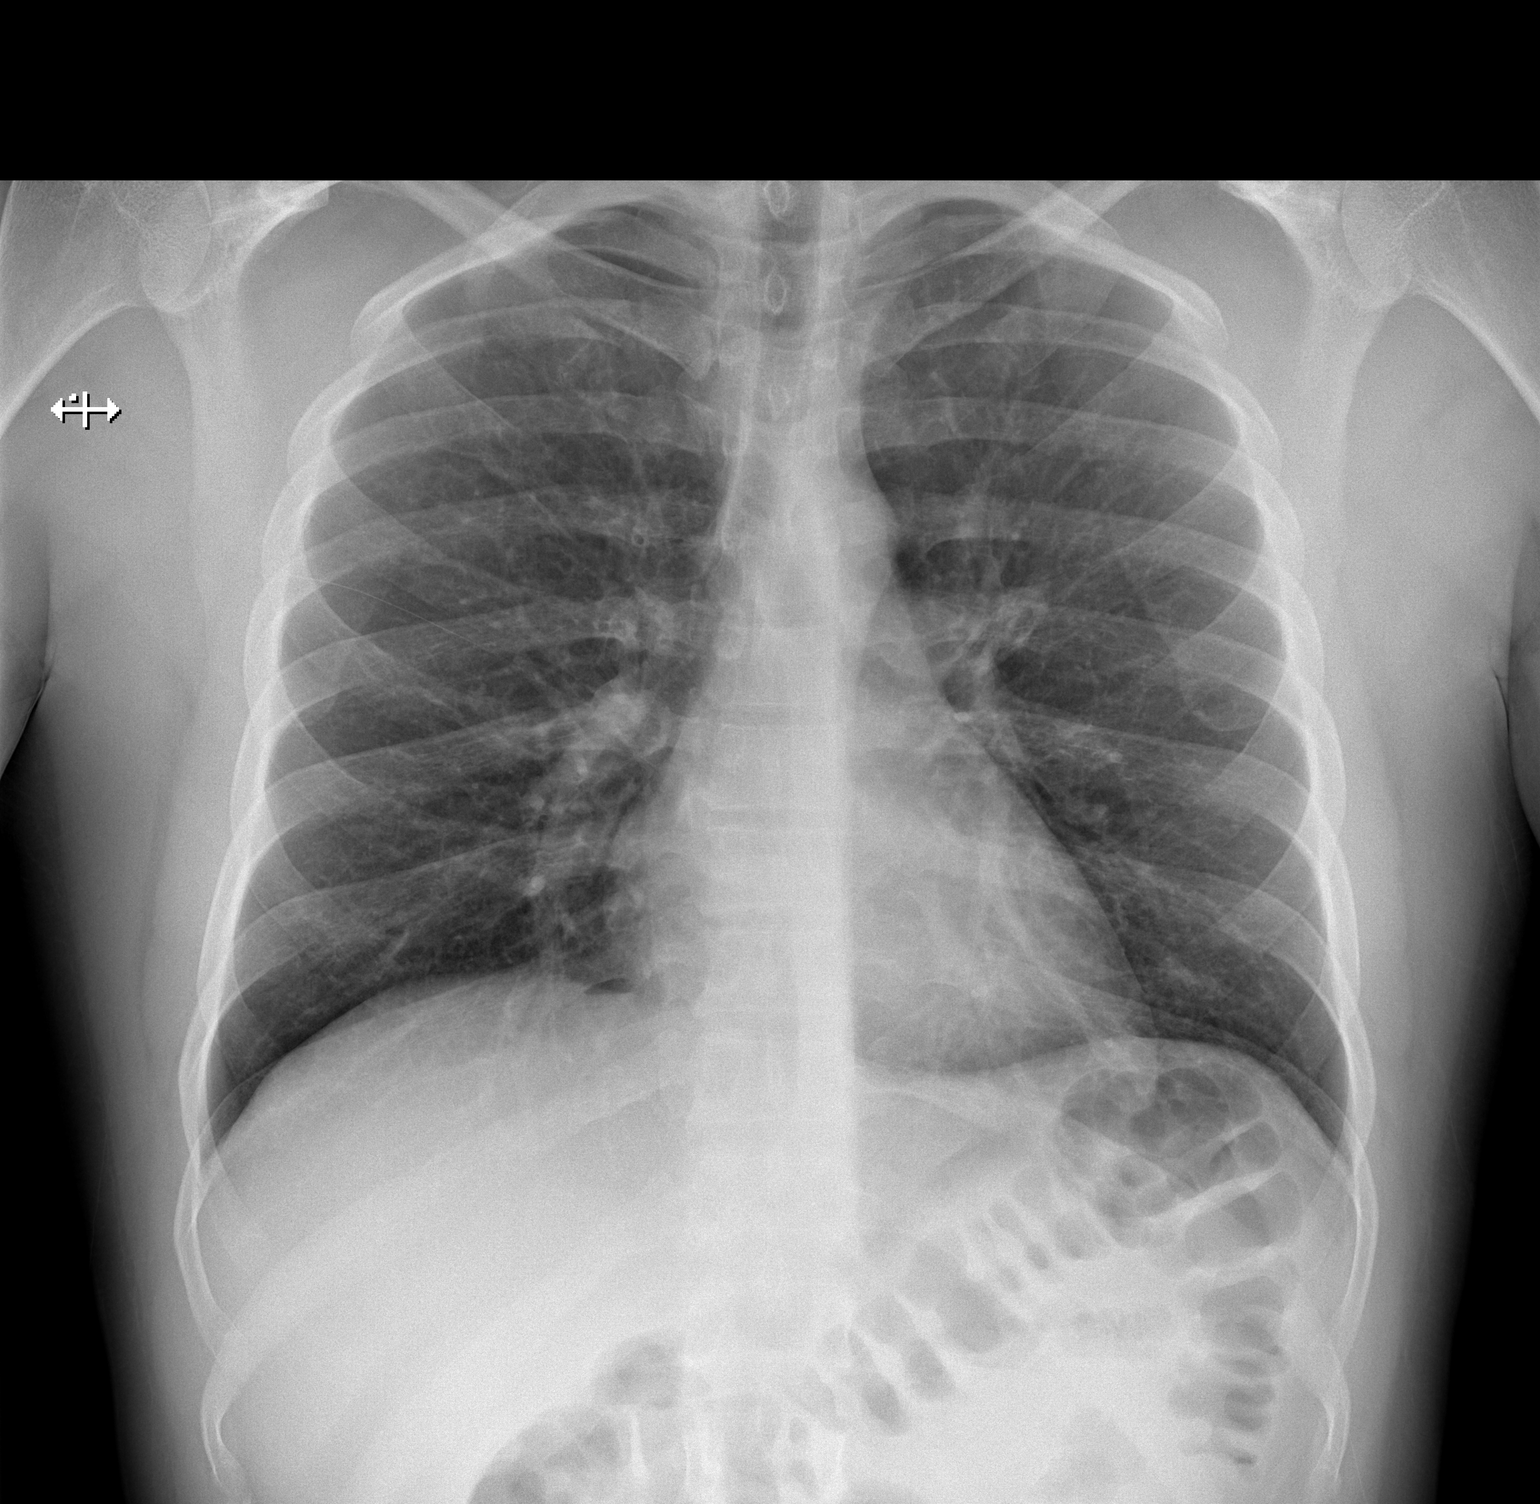

[w chest lat]
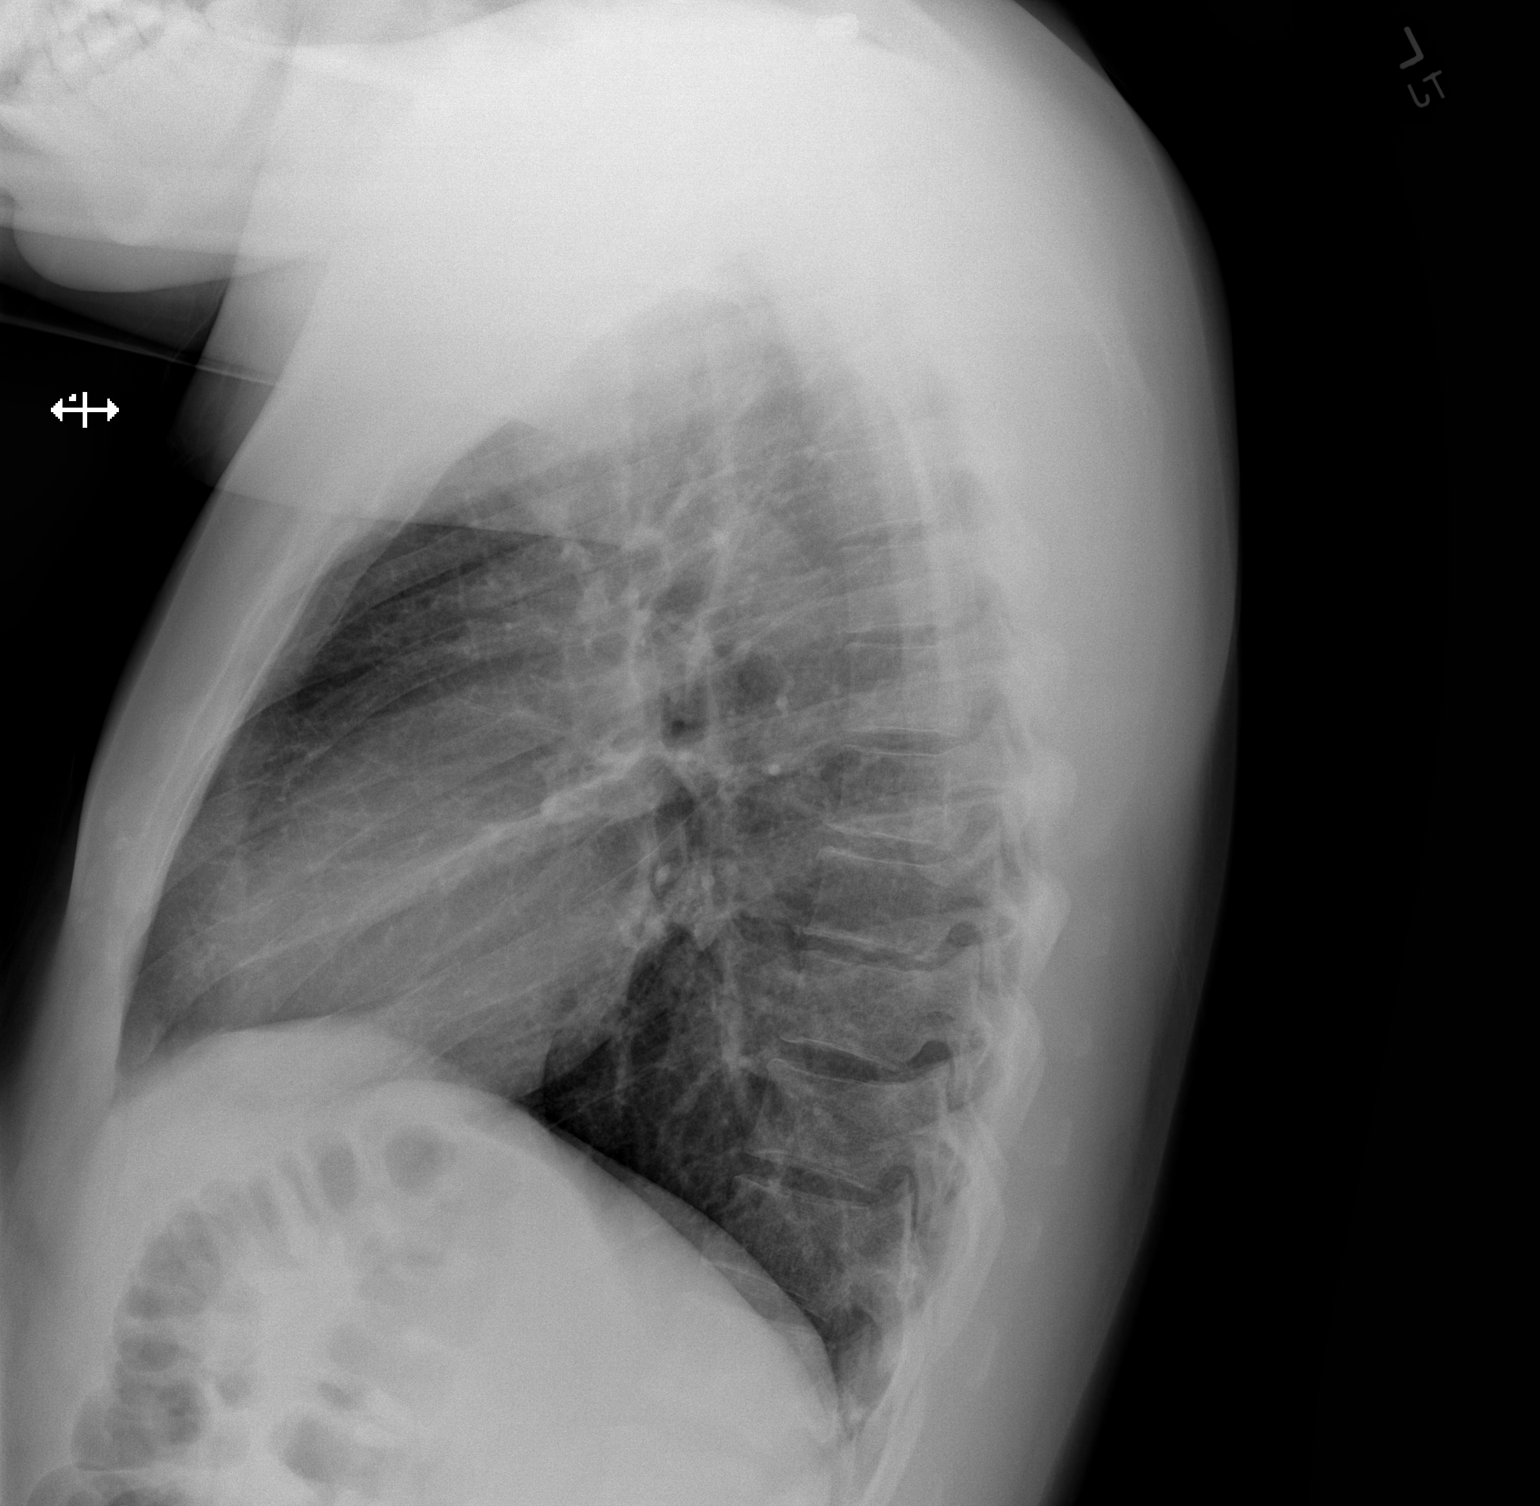

[2 of 2 positions shown; findings below may reference images not displayed]

FINDINGS: The heart size and mediastinal contours are within normal limits. No
focal consolidation. No pleural effusion. No pneumothorax. The
visualized skeletal structures are unremarkable.
IMPRESSION: No active cardiopulmonary disease.
